# Patient Record
Sex: Male | Born: 1970 | Race: White | Hispanic: No | Marital: Married | Smoking: Never smoker
Health system: Southern US, Community
[De-identification: ages and names within clinical notes are randomized; demographics above are authoritative.]

## PROBLEM LIST (undated history)

## (undated) DIAGNOSIS — I10 Essential (primary) hypertension: Secondary | ICD-10-CM

## (undated) DIAGNOSIS — E119 Type 2 diabetes mellitus without complications: Secondary | ICD-10-CM

## (undated) DIAGNOSIS — E7800 Pure hypercholesterolemia, unspecified: Secondary | ICD-10-CM

## (undated) HISTORY — PX: CHOLECYSTECTOMY: SHX55

---

## 2001-06-09 ENCOUNTER — Emergency Department (HOSPITAL_COMMUNITY): Admission: EM | Admit: 2001-06-09 | Discharge: 2001-06-09 | Payer: Self-pay | Admitting: Emergency Medicine

## 2001-10-22 ENCOUNTER — Emergency Department (HOSPITAL_COMMUNITY): Admission: EM | Admit: 2001-10-22 | Discharge: 2001-10-22 | Payer: Self-pay | Admitting: Internal Medicine

## 2011-12-18 ENCOUNTER — Emergency Department (HOSPITAL_BASED_OUTPATIENT_CLINIC_OR_DEPARTMENT_OTHER)
Admission: EM | Admit: 2011-12-18 | Discharge: 2011-12-18 | Disposition: A | Discharge status: Home / Self Care | Payer: Worker's Compensation | Attending: Emergency Medicine | Admitting: Emergency Medicine

## 2011-12-18 ENCOUNTER — Emergency Department (HOSPITAL_BASED_OUTPATIENT_CLINIC_OR_DEPARTMENT_OTHER): Payer: Worker's Compensation

## 2011-12-18 ENCOUNTER — Encounter (HOSPITAL_BASED_OUTPATIENT_CLINIC_OR_DEPARTMENT_OTHER): Payer: Self-pay | Admitting: Family Medicine

## 2011-12-18 DIAGNOSIS — S46909A Unspecified injury of unspecified muscle, fascia and tendon at shoulder and upper arm level, unspecified arm, initial encounter: Secondary | ICD-10-CM | POA: Insufficient documentation

## 2011-12-18 DIAGNOSIS — W2203XA Walked into furniture, initial encounter: Secondary | ICD-10-CM | POA: Insufficient documentation

## 2011-12-18 DIAGNOSIS — I10 Essential (primary) hypertension: Secondary | ICD-10-CM | POA: Insufficient documentation

## 2011-12-18 DIAGNOSIS — Y929 Unspecified place or not applicable: Secondary | ICD-10-CM | POA: Insufficient documentation

## 2011-12-18 DIAGNOSIS — M25519 Pain in unspecified shoulder: Secondary | ICD-10-CM

## 2011-12-18 DIAGNOSIS — Y9389 Activity, other specified: Secondary | ICD-10-CM | POA: Insufficient documentation

## 2011-12-18 DIAGNOSIS — S4980XA Other specified injuries of shoulder and upper arm, unspecified arm, initial encounter: Secondary | ICD-10-CM | POA: Insufficient documentation

## 2011-12-18 HISTORY — DX: Essential (primary) hypertension: I10

## 2011-12-18 MED ORDER — HYDROCODONE-ACETAMINOPHEN 5-325 MG PO TABS: 2.0000 | ORAL_TABLET | ORAL | Status: AC | PRN
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
## 2011-12-18 NOTE — ED Provider Notes (Signed)
Medical screening examination/treatment/procedure(s) were performed by non-physician practitioner and as supervising physician I was immediately available for consultation/collaboration.   Carleene Cooper III, MD 12/18/11 [phone]
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
## 2020-09-19 ENCOUNTER — Encounter (HOSPITAL_BASED_OUTPATIENT_CLINIC_OR_DEPARTMENT_OTHER): Payer: Self-pay | Admitting: *Deleted

## 2020-09-19 ENCOUNTER — Other Ambulatory Visit: Payer: Self-pay

## 2020-09-19 ENCOUNTER — Emergency Department (HOSPITAL_BASED_OUTPATIENT_CLINIC_OR_DEPARTMENT_OTHER): Payer: Managed Care, Other (non HMO)

## 2020-09-19 DIAGNOSIS — Z7984 Long term (current) use of oral hypoglycemic drugs: Secondary | ICD-10-CM | POA: Insufficient documentation

## 2020-09-19 DIAGNOSIS — Z79899 Other long term (current) drug therapy: Secondary | ICD-10-CM | POA: Insufficient documentation

## 2020-09-19 DIAGNOSIS — I10 Essential (primary) hypertension: Secondary | ICD-10-CM | POA: Diagnosis not present

## 2020-09-19 DIAGNOSIS — Z794 Long term (current) use of insulin: Secondary | ICD-10-CM | POA: Diagnosis not present

## 2020-09-19 DIAGNOSIS — E119 Type 2 diabetes mellitus without complications: Secondary | ICD-10-CM | POA: Diagnosis not present

## 2020-09-19 DIAGNOSIS — M79605 Pain in left leg: Secondary | ICD-10-CM | POA: Diagnosis not present

## 2020-09-19 DIAGNOSIS — R600 Localized edema: Secondary | ICD-10-CM | POA: Diagnosis not present

## 2020-09-19 NOTE — ED Triage Notes (Signed)
States he had a colonoscopy 4 days ago. 2 days later he noticed pain and swelling to his left lower leg and foot. He has a bruise behind his knee.

## 2020-09-20 ENCOUNTER — Emergency Department (HOSPITAL_BASED_OUTPATIENT_CLINIC_OR_DEPARTMENT_OTHER)
Admission: EM | Admit: 2020-09-20 | Discharge: 2020-09-20 | Disposition: A | Discharge status: Home / Self Care | Payer: Managed Care, Other (non HMO) | Attending: Emergency Medicine | Admitting: Emergency Medicine

## 2020-09-20 DIAGNOSIS — R609 Edema, unspecified: Secondary | ICD-10-CM

## 2020-09-20 HISTORY — DX: Pure hypercholesterolemia, unspecified: E78.00

## 2020-09-20 HISTORY — DX: Type 2 diabetes mellitus without complications: E11.9

## 2020-09-20 LAB — CBC WITH DIFFERENTIAL/PLATELET
Abs Immature Granulocytes: 0.04 10*3/uL (ref 0.00–0.07)
Basophils Absolute: 0.1 10*3/uL (ref 0.0–0.1)
Basophils Relative: 1 %
Eosinophils Absolute: 0.3 10*3/uL (ref 0.0–0.5)
Eosinophils Relative: 3 %
HCT: 44.9 % (ref 39.0–52.0)
Hemoglobin: 15.8 g/dL (ref 13.0–17.0)
Immature Granulocytes: 0 %
Lymphocytes Relative: 29 %
Lymphs Abs: 3.1 10*3/uL (ref 0.7–4.0)
MCH: 31.3 pg (ref 26.0–34.0)
MCHC: 35.2 g/dL (ref 30.0–36.0)
MCV: 88.9 fL (ref 80.0–100.0)
Monocytes Absolute: 0.9 10*3/uL (ref 0.1–1.0)
Monocytes Relative: 8 %
Neutro Abs: 6.3 10*3/uL (ref 1.7–7.7)
Neutrophils Relative %: 59 %
Platelets: 252 10*3/uL (ref 150–400)
RBC: 5.05 MIL/uL (ref 4.22–5.81)
RDW: 13.3 % (ref 11.5–15.5)
WBC: 10.7 10*3/uL — ABNORMAL HIGH (ref 4.0–10.5)
nRBC: 0 % (ref 0.0–0.2)

## 2020-09-20 LAB — COMPREHENSIVE METABOLIC PANEL
ALT: 36 U/L (ref 0–44)
AST: 24 U/L (ref 15–41)
Albumin: 4 g/dL (ref 3.5–5.0)
Alkaline Phosphatase: 84 U/L (ref 38–126)
Anion gap: 8 (ref 5–15)
BUN: 13 mg/dL (ref 6–20)
CO2: 29 mmol/L (ref 22–32)
Calcium: 9.3 mg/dL (ref 8.9–10.3)
Chloride: 98 mmol/L (ref 98–111)
Creatinine, Ser: 0.84 mg/dL (ref 0.61–1.24)
GFR, Estimated: >60 mL/min (ref >60)
Glucose, Bld: 266 mg/dL — ABNORMAL HIGH (ref 70–99)
Potassium: 3.8 mmol/L (ref 3.5–5.1)
Sodium: 135 mmol/L (ref 135–145)
Total Bilirubin: 0.5 mg/dL (ref 0.3–1.2)
Total Protein: 7.8 g/dL (ref 6.5–8.1)

## 2020-09-20 LAB — D-DIMER, QUANTITATIVE: D-Dimer, Quant: 0.38 ug/mL-FEU (ref 0.00–0.50)

## 2020-09-20 NOTE — ED Notes (Signed)
Discharge instructions discussed with pt. Pt verbalized understanding. Pt stable and ambulatory.  °

## 2020-09-20 NOTE — ED Provider Notes (Signed)
[facility] EMERGENCY DEPARTMENT Provider Note   CSN: [id number] Arrival date & time: 09/19/20  2031     History Chief Complaint  Patient presents with   Leg Pain    Robert Wilkins is a 50 y.o. male.  Had a colonoscopy last Friday. The last 2-3 days has had left leg tight feeling and edema. Today both are swollen. Is worried about dvt. No cp, sob. Urinating ok.    Leg Pain     Past Medical History:  Diagnosis Date   Diabetes mellitus without complication (HCC)    High cholesterol    Hypertension     There are no problems to display for this patient.   Past Surgical History:  Procedure Laterality Date   CHOLECYSTECTOMY         No family history on file.  Social History   Tobacco Use   Smoking status: Never   Smokeless tobacco: Never  Substance Use Topics   Alcohol use: No   Drug use: No    Home Medications Prior to Admission medications   Medication Sig Start Date End Date Taking? Authorizing Provider  albuterol (VENTOLIN HFA) 108 (90 Base) MCG/ACT inhaler Inhale into the lungs. 06/30/20  Yes [provider]  atorvastatin (LIPITOR) 80 MG tablet Take 1 tablet by mouth daily. 01/22/19  Yes [provider]  glipiZIDE (GLUCOTROL) 5 MG tablet Take by mouth. 05/08/20  Yes [provider]  insulin degludec (TRESIBA) 100 UNIT/ML FlexTouch Pen Use as directed. Max daily dose is 50 units. 05/08/20  Yes [provider]  losartan (COZAAR) 100 MG tablet Take 1 tablet by mouth daily. 03/31/20  Yes [provider]  pioglitazone (ACTOS) 45 MG tablet Take by mouth. 05/08/20  Yes [provider]  HYDROcodone-acetaminophen (NORCO/VICODIN) 5-325 MG per tablet Take 2 tablets by mouth every 4 (four) hours as needed for pain. 12/18/11   Teressa Lower, NP  Multiple Vitamin (MULTI-VITAMIN) tablet Take 1 tablet by mouth daily.    [provider]    Allergies    Codeine, Empagliflozin-linaglip-metform,  Metformin, and Semaglutide  Review of Systems   Review of Systems  All other systems reviewed and are negative.  Physical Exam Updated Vital Signs BP (!) 168/103 (BP Location: Left Arm)   Pulse 76   Temp 97.8 F (36.6 C) (Oral)   Resp 18   Ht 5\' 9"  (1.753 m)   Wt 117.9 kg   SpO2 98%   BMI 38.40 kg/m   Physical Exam Vitals and nursing note reviewed.  Constitutional:      Appearance: He is well-developed.  HENT:     Head: Normocephalic and atraumatic.     Nose: Nose normal. No congestion or rhinorrhea.     Mouth/Throat:     Mouth: Mucous membranes are moist.     Pharynx: Oropharynx is clear.  Cardiovascular:     Rate and Rhythm: Normal rate.  Pulmonary:     Effort: Pulmonary effort is normal. No respiratory distress.  Abdominal:     General: There is no distension.  Musculoskeletal:        General: No swelling. Normal range of motion.     Cervical back: Normal range of motion.     Right lower leg: Edema present.     Left lower leg: Edema present.  Skin:    General: Skin is warm and dry.  Neurological:     General: No focal deficit present.     Mental Status: He is alert.  ED Results / Procedures / Treatments   Labs (all labs ordered are listed, but only abnormal results are displayed) Labs Reviewed  CBC WITH DIFFERENTIAL/PLATELET - Abnormal; Notable for the following components:      Result Value   WBC 10.7 (*)    All other components within normal limits  COMPREHENSIVE METABOLIC PANEL - Abnormal; Notable for the following components:   Glucose, Bld 266 (*)    All other components within normal limits  D-DIMER, QUANTITATIVE    EKG None  Radiology US Venous Img Lower Unilateral Left  Result Date: 09/19/2020 CLINICAL DATA:  Left lower extremity swelling. EXAM: LEFT LOWER EXTREMITY VENOUS DOPPLER ULTRASOUND TECHNIQUE: Gray-scale sonography with compression, as well as color and duplex ultrasound, were performed to evaluate the deep venous system(s) from  the level of the common femoral vein through the popliteal and proximal calf veins. COMPARISON:  None. FINDINGS: VENOUS Normal compressibility of the common femoral, superficial femoral, and popliteal veins, as well as the visualized calf veins. Visualized portions of profunda femoral vein and great saphenous vein unremarkable. No filling defects to suggest DVT on grayscale or color Doppler imaging. Doppler waveforms show normal direction of venous flow, normal respiratory plasticity and response to augmentation. Limited views of the contralateral common femoral vein are unremarkable. OTHER None. Limitations: none IMPRESSION: No evidence of left lower extremity DVT. Electronically Signed   By: Narda Rutherford M.D.   On: 09/19/2020 22:25    Procedures Procedures   Medications Ordered in ED Medications - No data to display  ED Course  I have reviewed the triage vital signs and the nursing notes.  Pertinent labs & imaging results that were available during my care of the patient were reviewed by me and considered in my medical decision making (see chart for details).    MDM Rules/Calculators/A&P                         Unclear etiology for his leg swelling. No e/o heart or renal failure. US/d dimer negative, doubt DVT. Stable for discharge with pcp follow up as needed.   Final Clinical Impression(s) / ED Diagnoses Final diagnoses:  Peripheral edema    Rx / DC Orders ED Discharge Orders     None        Zebastian Carico, Barbara Cower, MD 09/20/20 [phone]

## 2022-05-07 IMAGING — US US EXTREM LOW VENOUS*L*
1 series · 14 of 24 positions shown · non-contrast
Comparison: None.

CLINICAL DATA: Left lower extremity swelling.

EXAM:
LEFT LOWER EXTREMITY VENOUS DOPPLER ULTRASOUND
TECHNIQUE: Gray-scale sonography with compression, as well as color and duplex
ultrasound, were performed to evaluate the deep venous system(s)
from the level of the common femoral vein through the popliteal and
proximal calf veins.

[Series 1: us extrem low venous*left* · 14 of 31 slices shown]
[im 1/31]
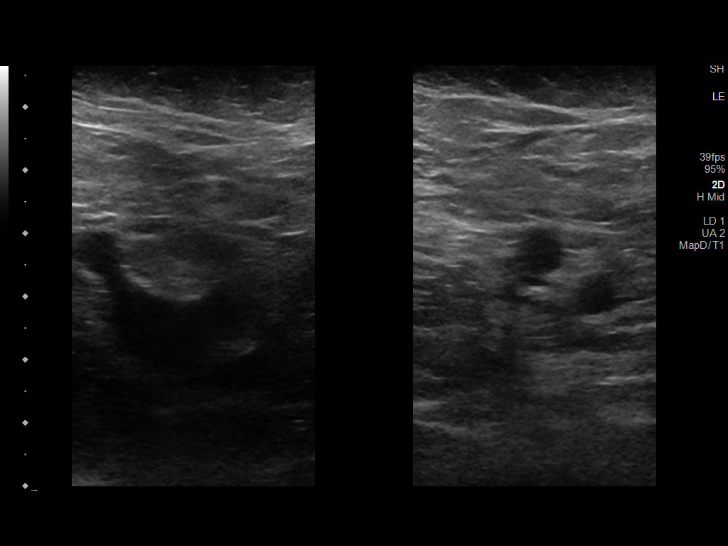
[im 3/31]
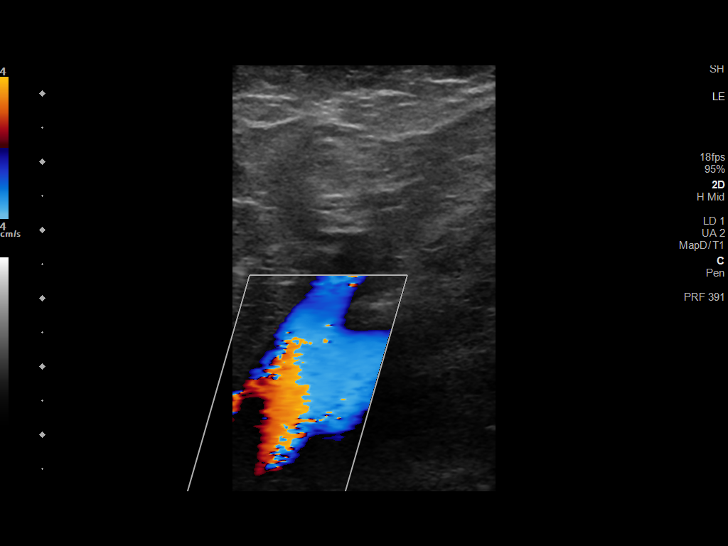
[im 6/31]
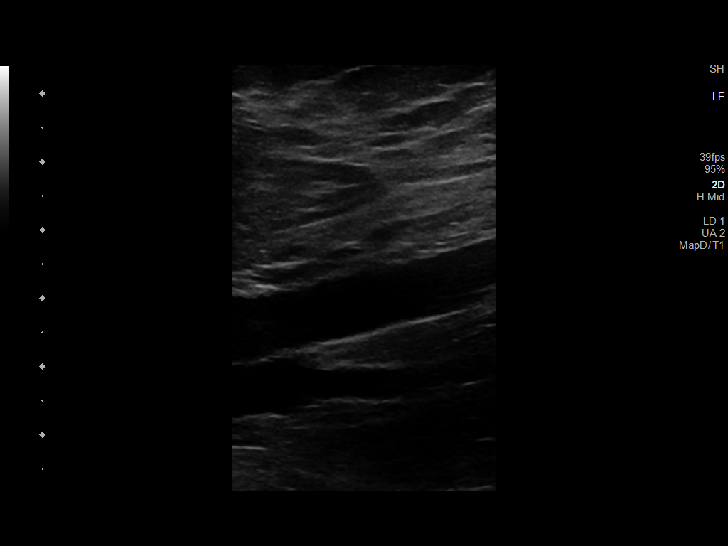
[im 8/31]
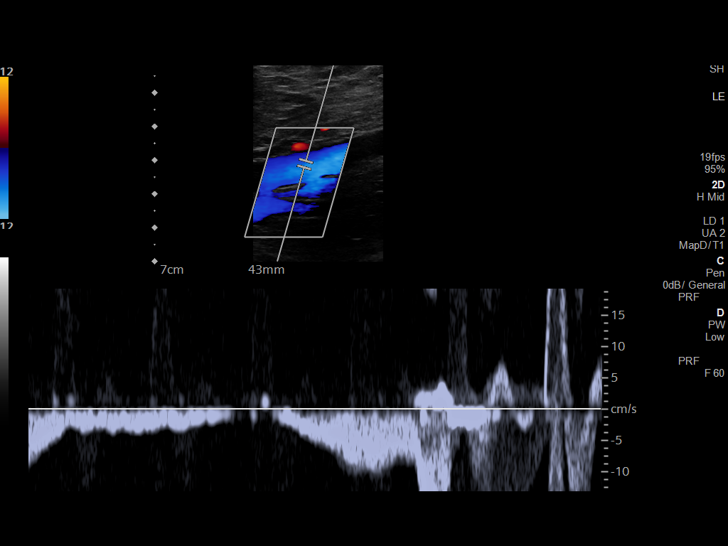
[im 10/31]
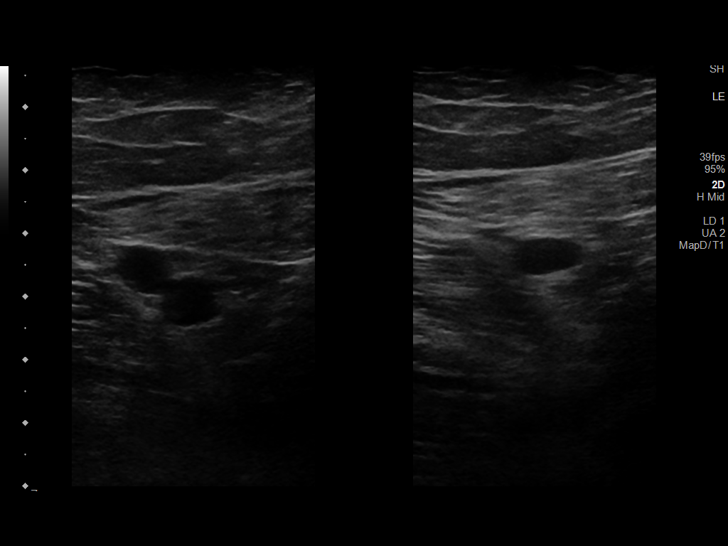
[im 12/31]
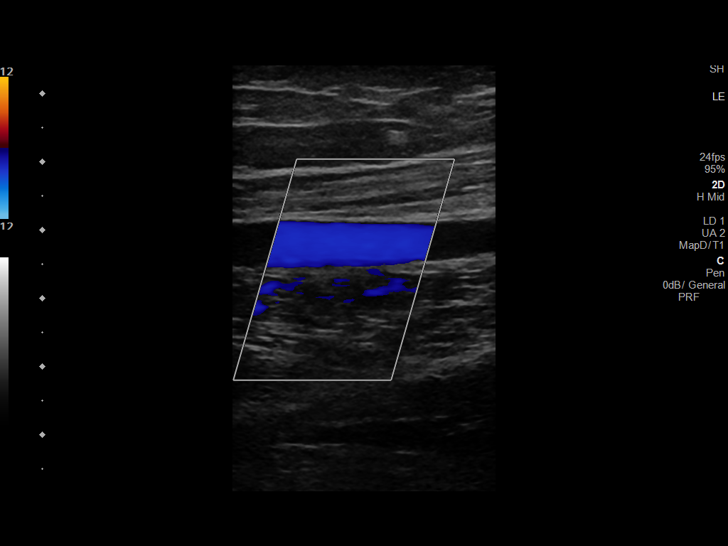
[im 15/31]
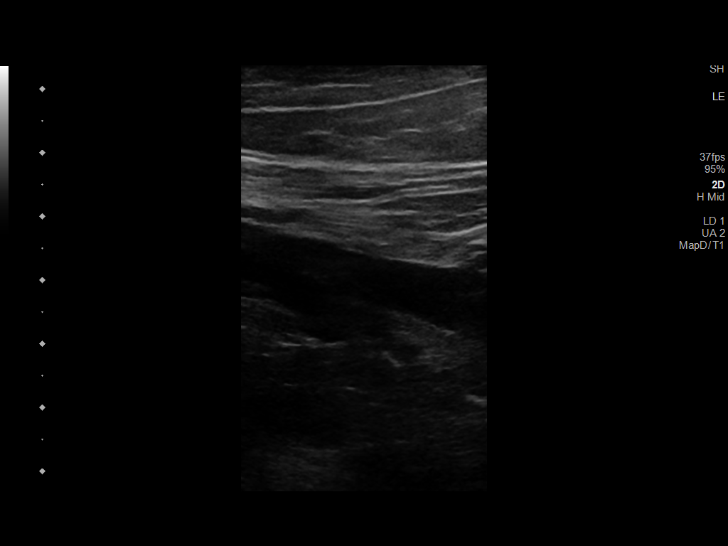
[im 16/31]
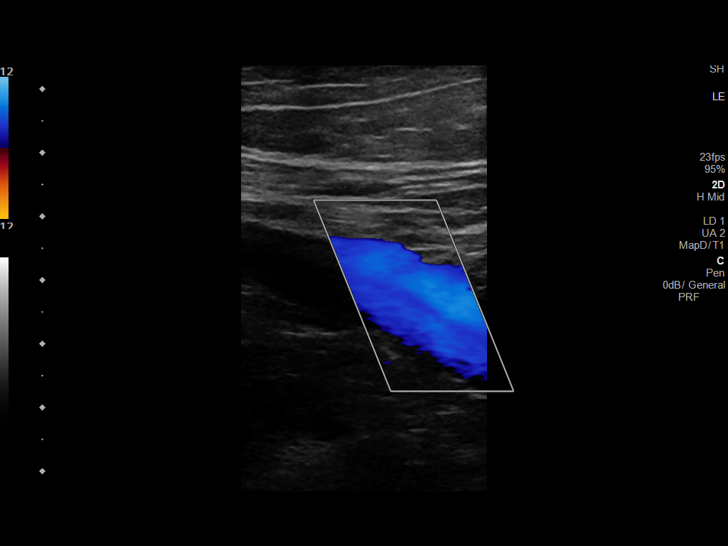
[im 19/31]
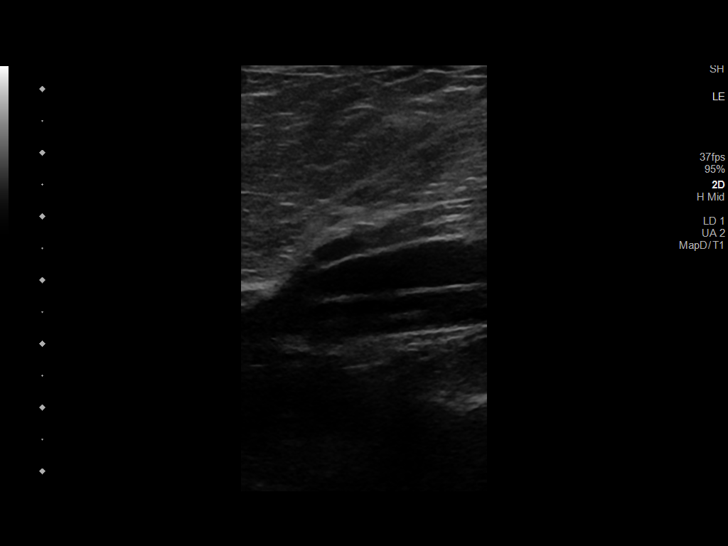
[im 21/31]
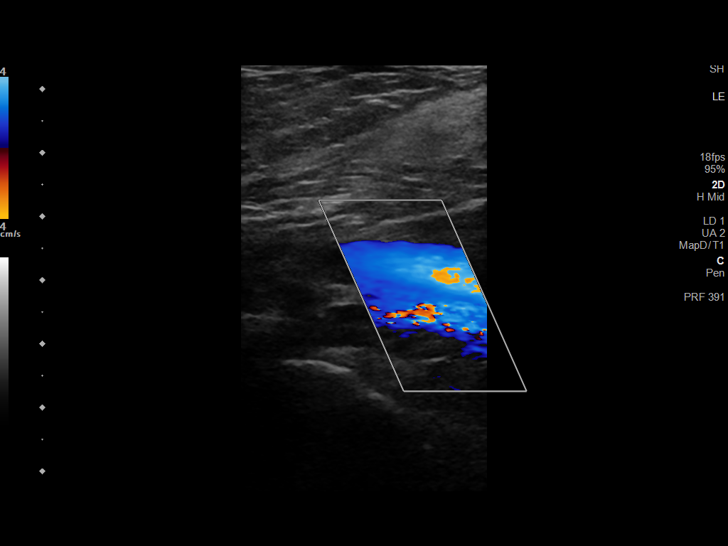
[im 24/31]
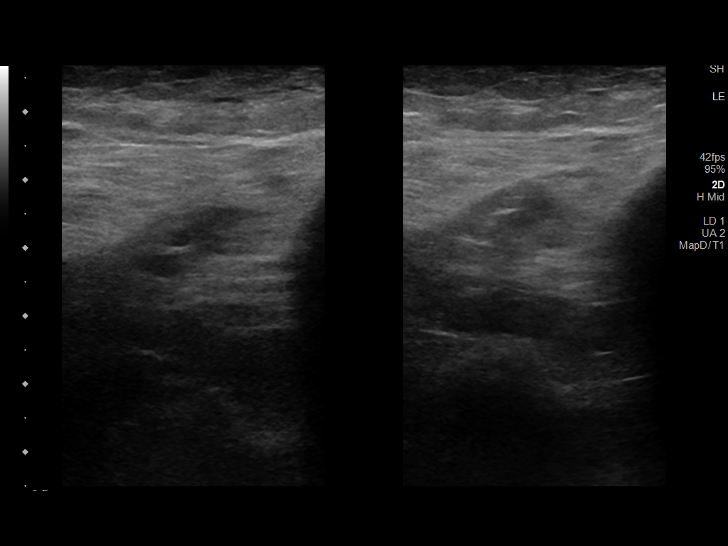
[im 25/31]
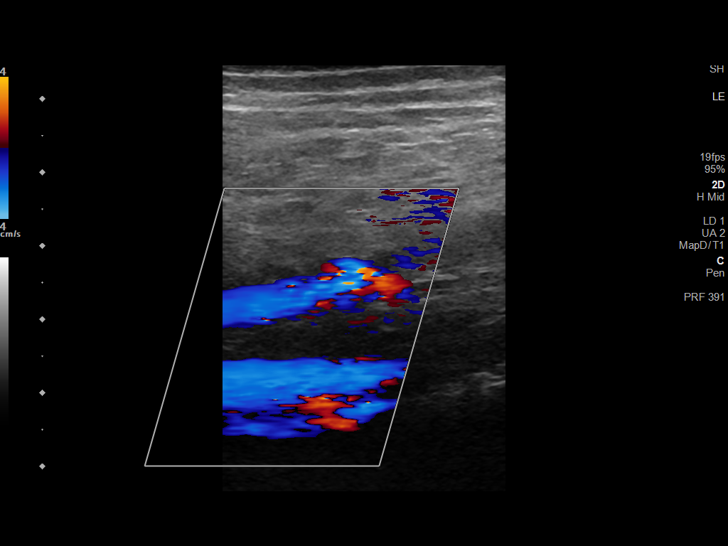
[im 28/31]
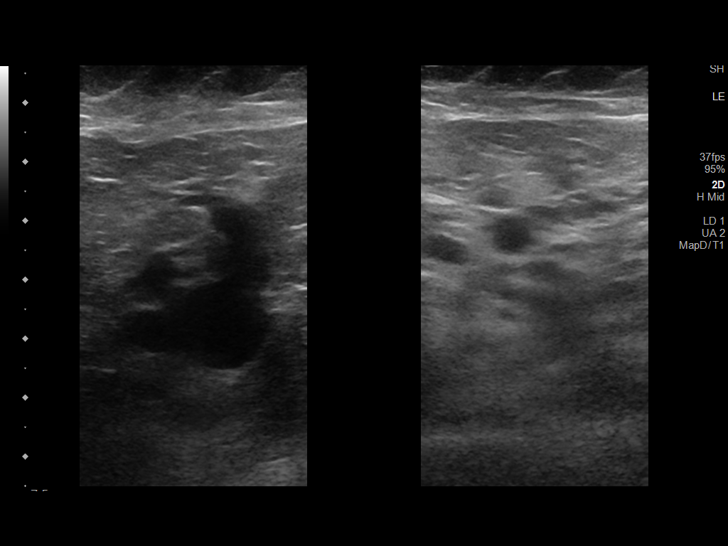
[im 31/31]
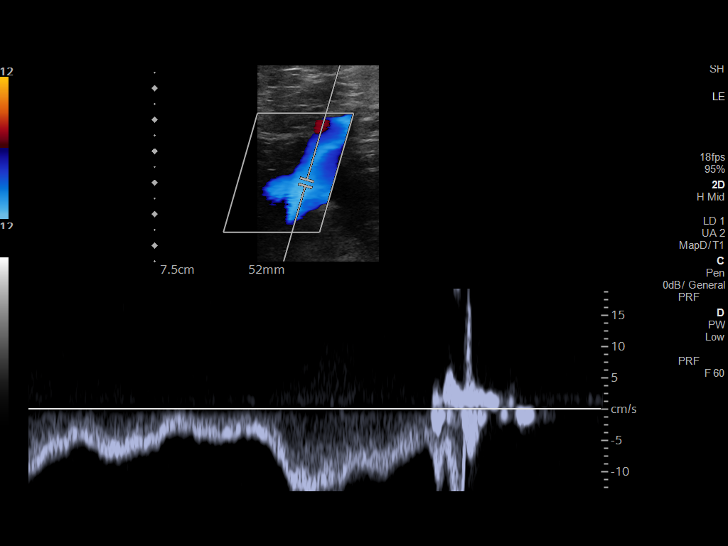

[14 of 24 positions shown; findings below may reference images not displayed]

FINDINGS: VENOUS

Normal compressibility of the common femoral, superficial femoral,
and popliteal veins, as well as the visualized calf veins.
Visualized portions of profunda femoral vein and great saphenous
vein unremarkable. No filling defects to suggest DVT on grayscale or
color Doppler imaging. Doppler waveforms show normal direction of
venous flow, normal respiratory plasticity and response to
augmentation.

Limited views of the contralateral common femoral vein are
unremarkable.

OTHER

None.

Limitations: none
IMPRESSION: No evidence of left lower extremity DVT.
